# Patient Record
Sex: Female | Born: 2000 | Hispanic: Yes | Marital: Single | Smoking: Never smoker
Health system: Southern US, Community
[De-identification: ages and names within clinical notes are randomized; demographics above are authoritative.]

## PROBLEM LIST (undated history)

## (undated) DIAGNOSIS — N912 Amenorrhea, unspecified: Secondary | ICD-10-CM

## (undated) HISTORY — PX: APPENDECTOMY: SHX54

## (undated) HISTORY — DX: Amenorrhea, unspecified: N91.2

---
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
## 2022-03-01 IMAGING — US US OB < 14 WEEKS - US OB TV
1 series · 14 of 28 positions shown · non-contrast
Comparison: None.

CLINICAL DATA: Twelve weeks 0 days pregnant by last menstrual.
Vaginal bleeding for 10 days.

EXAM:
OBSTETRIC <14 WK US AND TRANSVAGINAL OB US
TECHNIQUE: Both transabdominal and transvaginal ultrasound examinations were
performed for complete evaluation of the gestation as well as the
maternal uterus, adnexal regions, and pelvic cul-de-sac.
Transvaginal technique was performed to assess early pregnancy.

[Series 1: early ob us · arterial · 14 of 112 slices shown]
[im 5/112]
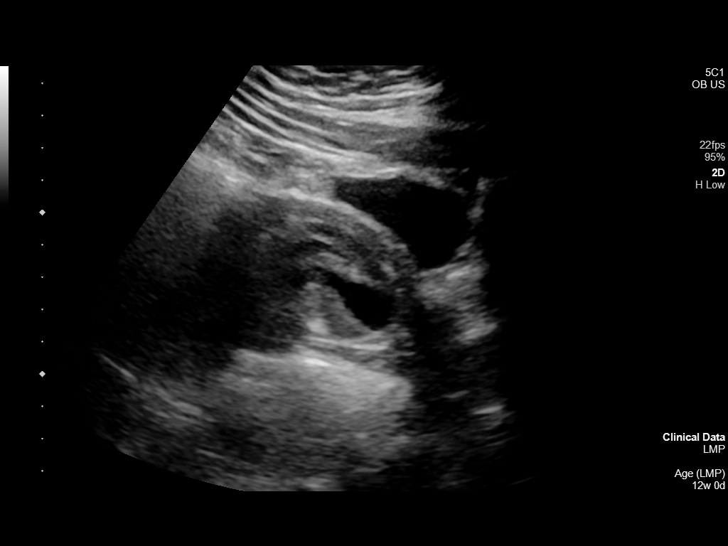
[im 13/112]
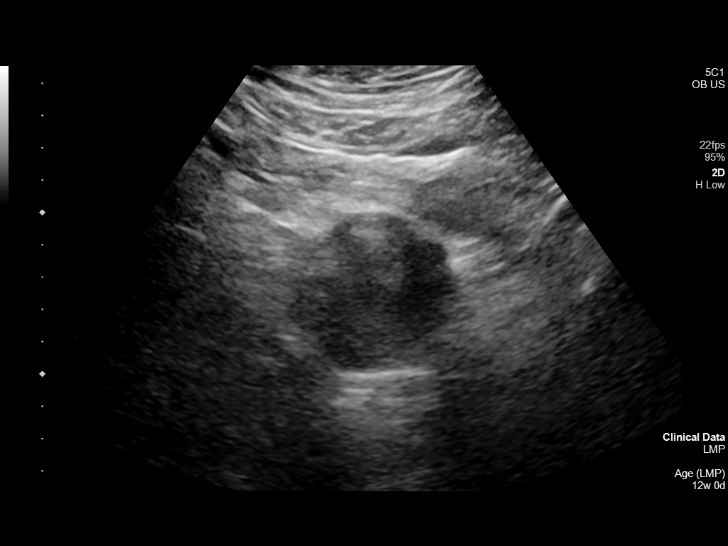
[im 21/112]
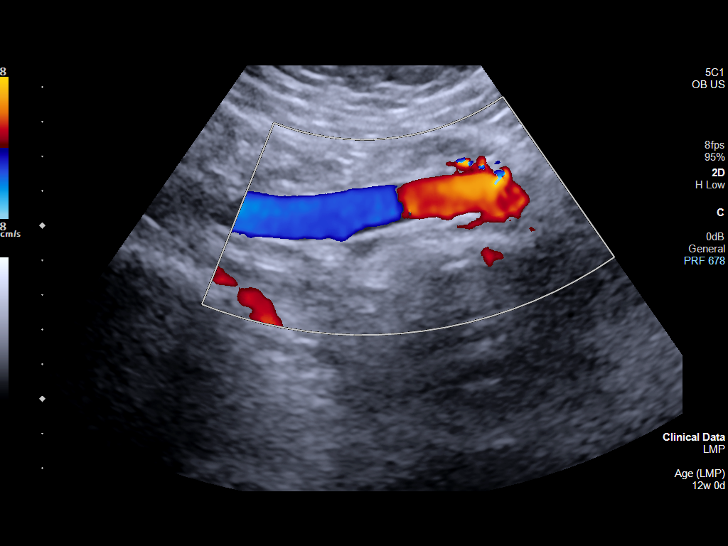
[im 29/112]
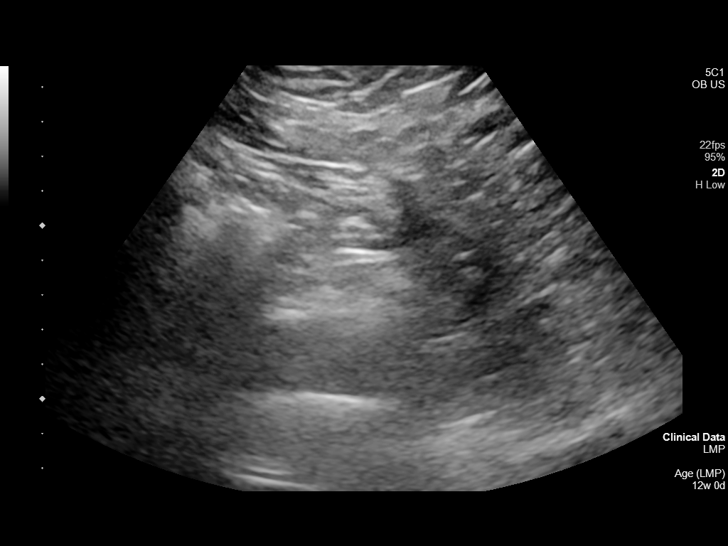
[im 38/112]
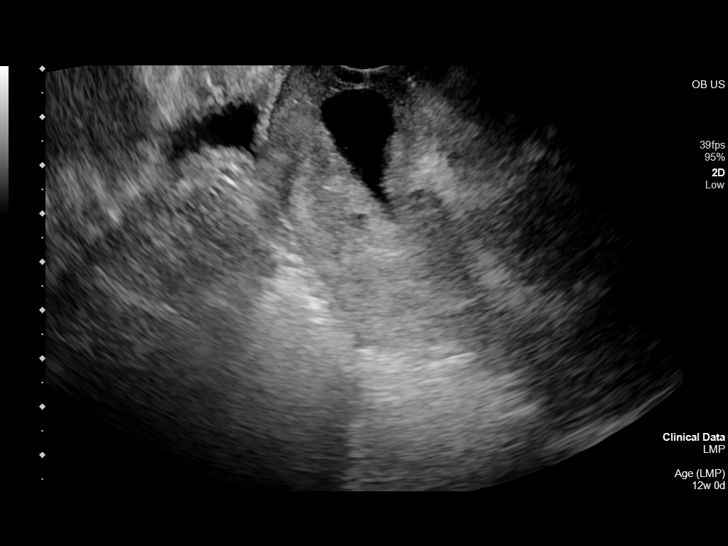
[im 46/112]
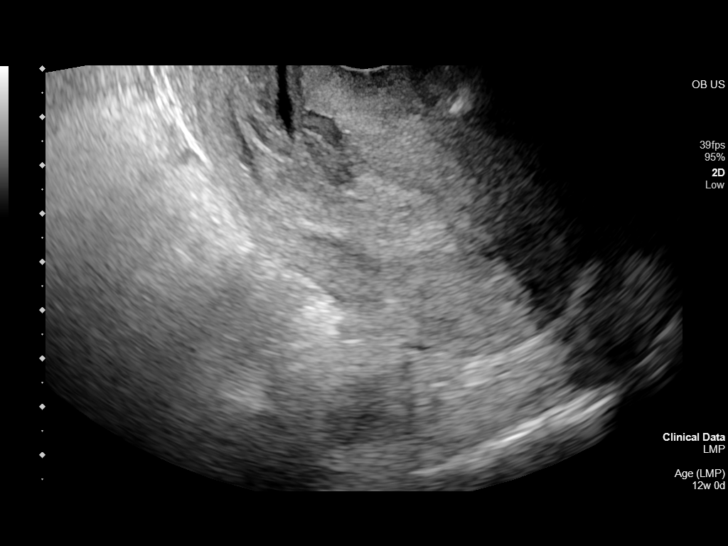
[im 54/112]
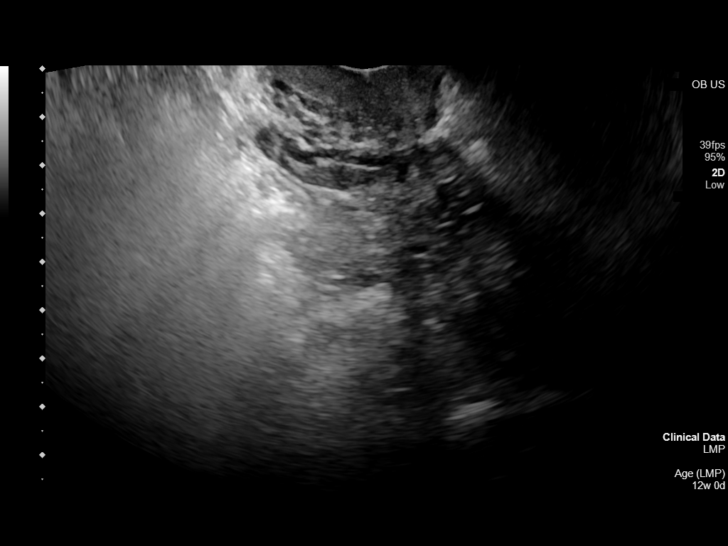
[im 62/112]
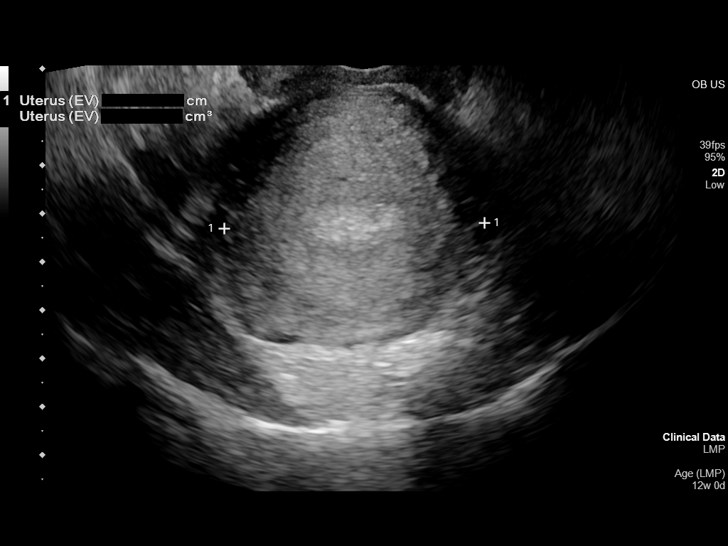
[im 70/112]
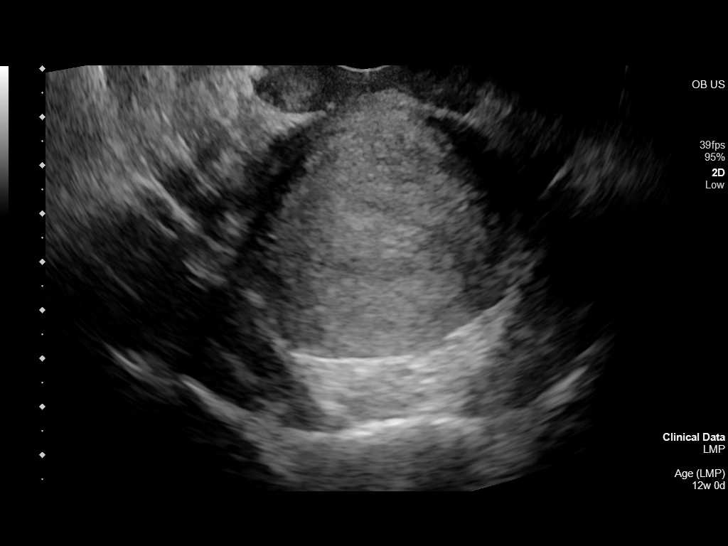
[im 79/112]
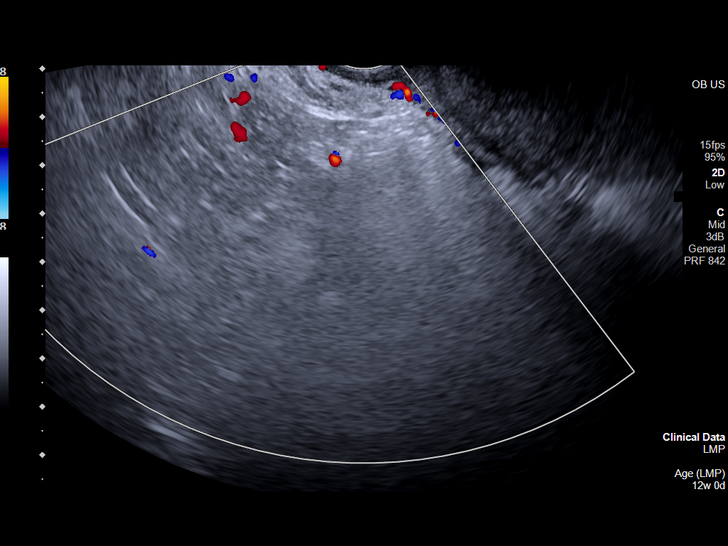
[im 87/112]
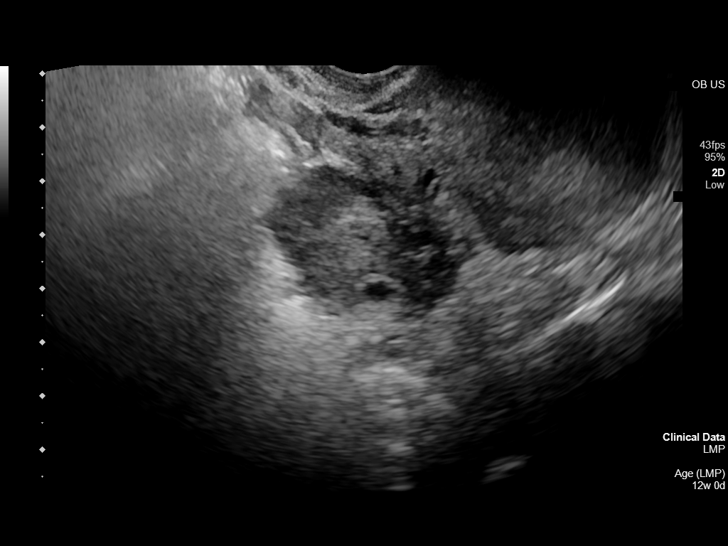
[im 95/112]
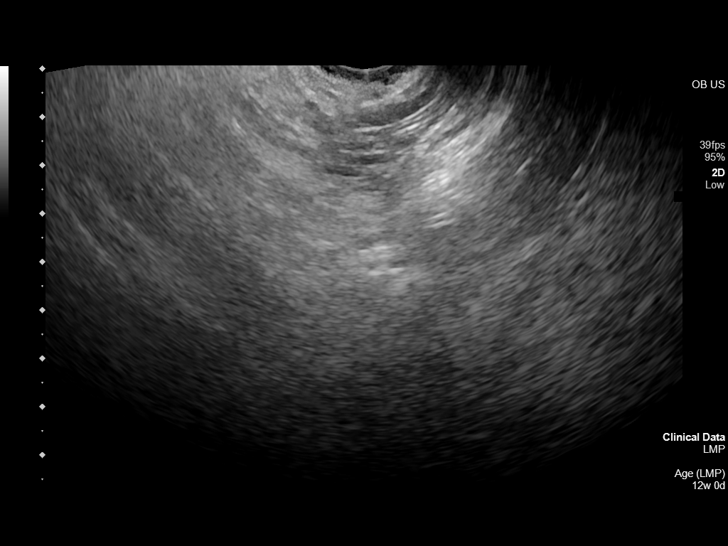
[im 103/112]
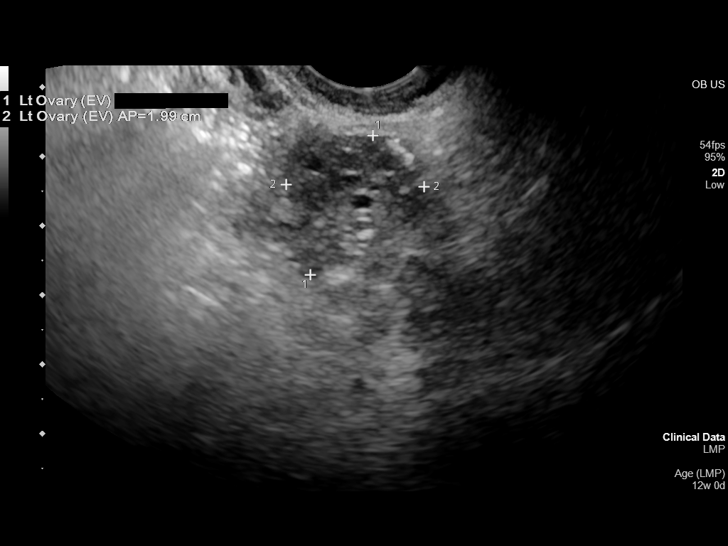
[im 112/112]
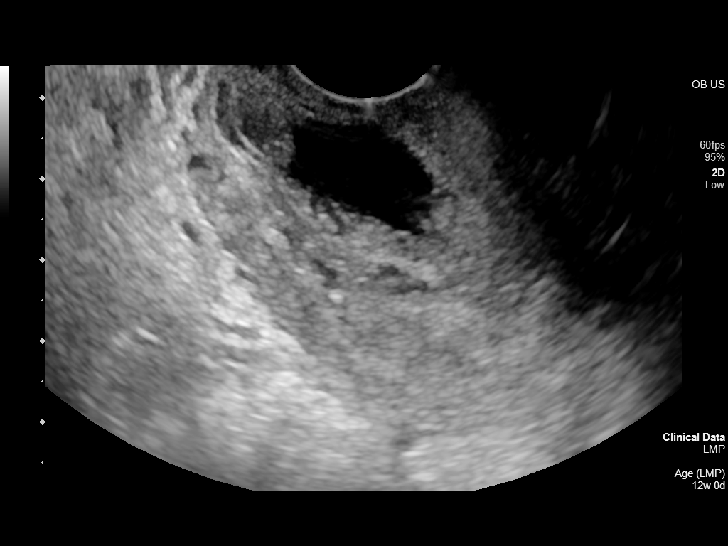

[14 of 28 positions shown; findings below may reference images not displayed]

FINDINGS: Intrauterine gestational sac: Gestational sac positioned within the
uterine cervix.

Yolk sac:  Absent

Embryo:  Absent

Cardiac Activity: Absent

MSD: 18.3 mm   6 w   5 d

Subchorionic hemorrhage:  None visualized.

Maternal uterus/adnexae: Normal ovaries, without adnexal mass. Trace
cul-de-sac fluid is likely physiologic.
IMPRESSION: Gestational sac positioned within the uterine cervix. No evidence of
yolk sac or fetal pole.
